# Patient Record
Sex: Female | Born: 1955 | Race: White | Hispanic: No | Marital: Single | Smoking: Never smoker
Health system: Southern US, Community
[De-identification: ages and names within clinical notes are randomized; demographics above are authoritative.]

## PROBLEM LIST (undated history)

## (undated) DIAGNOSIS — IMO0002 Reserved for concepts with insufficient information to code with codable children: Secondary | ICD-10-CM

## (undated) DIAGNOSIS — N841 Polyp of cervix uteri: Secondary | ICD-10-CM

## (undated) DIAGNOSIS — R6882 Decreased libido: Secondary | ICD-10-CM

## (undated) DIAGNOSIS — D219 Benign neoplasm of connective and other soft tissue, unspecified: Secondary | ICD-10-CM

## (undated) DIAGNOSIS — G4700 Insomnia, unspecified: Secondary | ICD-10-CM

## (undated) DIAGNOSIS — A64 Unspecified sexually transmitted disease: Secondary | ICD-10-CM

## (undated) DIAGNOSIS — F419 Anxiety disorder, unspecified: Secondary | ICD-10-CM

## (undated) HISTORY — DX: Reserved for concepts with insufficient information to code with codable children: IMO0002

## (undated) HISTORY — DX: Anxiety disorder, unspecified: F41.9

## (undated) HISTORY — DX: Insomnia, unspecified: G47.00

## (undated) HISTORY — DX: Benign neoplasm of connective and other soft tissue, unspecified: D21.9

## (undated) HISTORY — DX: Polyp of cervix uteri: N84.1

## (undated) HISTORY — DX: Decreased libido: R68.82

## (undated) HISTORY — DX: Unspecified sexually transmitted disease: A64

---

## 1996-11-15 DIAGNOSIS — R6882 Decreased libido: Secondary | ICD-10-CM

## 1996-11-15 HISTORY — DX: Decreased libido: R68.82

## 1998-03-04 ENCOUNTER — Other Ambulatory Visit: Admission: RE | Admit: 1998-03-04 | Discharge: 1998-03-04 | Payer: Self-pay | Admitting: Obstetrics and Gynecology

## 1999-07-28 ENCOUNTER — Other Ambulatory Visit: Admission: RE | Admit: 1999-07-28 | Discharge: 1999-07-28 | Payer: Self-pay | Admitting: Obstetrics and Gynecology

## 2000-07-28 ENCOUNTER — Other Ambulatory Visit: Admission: RE | Admit: 2000-07-28 | Discharge: 2000-07-28 | Payer: Self-pay | Admitting: Obstetrics and Gynecology

## 2001-08-29 ENCOUNTER — Other Ambulatory Visit: Admission: RE | Admit: 2001-08-29 | Discharge: 2001-08-29 | Payer: Self-pay | Admitting: Obstetrics and Gynecology

## 2002-08-29 ENCOUNTER — Other Ambulatory Visit: Admission: RE | Admit: 2002-08-29 | Discharge: 2002-08-29 | Payer: Self-pay | Admitting: Obstetrics and Gynecology

## 2004-01-01 ENCOUNTER — Other Ambulatory Visit: Admission: RE | Admit: 2004-01-01 | Discharge: 2004-01-01 | Payer: Self-pay | Admitting: Obstetrics and Gynecology

## 2005-02-08 ENCOUNTER — Other Ambulatory Visit: Admission: RE | Admit: 2005-02-08 | Discharge: 2005-02-08 | Payer: Self-pay | Admitting: Obstetrics and Gynecology

## 2005-04-30 ENCOUNTER — Ambulatory Visit (HOSPITAL_COMMUNITY): Admission: RE | Admit: 2005-04-30 | Discharge: 2005-04-30 | Payer: Self-pay | Admitting: Gastroenterology

## 2006-04-13 ENCOUNTER — Other Ambulatory Visit: Admission: RE | Admit: 2006-04-13 | Discharge: 2006-04-13 | Payer: Self-pay | Admitting: Obstetrics and Gynecology

## 2008-02-06 ENCOUNTER — Other Ambulatory Visit: Admission: RE | Admit: 2008-02-06 | Discharge: 2008-02-06 | Payer: Self-pay | Admitting: Obstetrics and Gynecology

## 2008-11-15 DIAGNOSIS — D219 Benign neoplasm of connective and other soft tissue, unspecified: Secondary | ICD-10-CM

## 2008-11-15 HISTORY — PX: HYSTEROSCOPY: SHX211

## 2008-11-15 HISTORY — DX: Benign neoplasm of connective and other soft tissue, unspecified: D21.9

## 2009-02-19 ENCOUNTER — Other Ambulatory Visit: Admission: RE | Admit: 2009-02-19 | Discharge: 2009-02-19 | Payer: Self-pay | Admitting: Obstetrics and Gynecology

## 2009-03-31 ENCOUNTER — Ambulatory Visit (HOSPITAL_BASED_OUTPATIENT_CLINIC_OR_DEPARTMENT_OTHER): Admission: RE | Admit: 2009-03-31 | Discharge: 2009-03-31 | Payer: Self-pay | Admitting: Obstetrics and Gynecology

## 2009-03-31 ENCOUNTER — Encounter: Payer: Self-pay | Admitting: Obstetrics and Gynecology

## 2009-03-31 DIAGNOSIS — R87619 Unspecified abnormal cytological findings in specimens from cervix uteri: Secondary | ICD-10-CM

## 2009-03-31 DIAGNOSIS — IMO0002 Reserved for concepts with insufficient information to code with codable children: Secondary | ICD-10-CM

## 2009-03-31 HISTORY — DX: Reserved for concepts with insufficient information to code with codable children: IMO0002

## 2009-03-31 HISTORY — DX: Unspecified abnormal cytological findings in specimens from cervix uteri: R87.619

## 2011-02-23 LAB — CBC
HCT: 42.9 % (ref 36.0–46.0)
Hemoglobin: 14.8 g/dL (ref 12.0–15.0)
MCHC: 34.5 g/dL (ref 30.0–36.0)
MCV: 91.2 fL (ref 78.0–100.0)
Platelets: 241 10*3/uL (ref 150–400)
RDW: 12.1 % (ref 11.5–15.5)

## 2011-03-30 NOTE — Op Note (Signed)
Lori, Hampton               ACCOUNT NO.:  [id number]   MEDICAL RECORD NO.:  [id number]          PATIENT TYPE:  AMB   LOCATION:  [facility]                         FACILITY:  [facility]   PHYSICIAN:  Layken P. Romine, M.D.DATE OF BIRTH:  01/21/1956   DATE OF PROCEDURE:  03/31/2009  DATE OF DISCHARGE:                               OPERATIVE REPORT   PREOPERATIVE DIAGNOSIS:  Atypical endometrial cells on Pap, known  fibroid.   POSTOPERATIVE DIAGNOSIS:  Atypical endometrial cells on Pap, known  fibroid, pathology pending.   PROCEDURE:  Hysteroscopic resection of small polyps and hysteroscopic  resection of multiple submucous fibroids.   SURGEON:  Ciera P. Romine, M.D.   ANESTHESIA:  General by LMA.   ESTIMATED BLOOD LOSS:  Minimal.   SORBITOL DEFICIT:  275 mL.   COMPLICATIONS:  None.   PROCEDURE:  The patient was taken to the operating room and after  induction of adequate general anesthesia by LMA was placed in dorsal  lithotomy position and prepped and draped in the usual fashion.  The  bladder was drained with a rubber catheter.  Posterior weighted and  anterior Sims retractor were placed and a paracervical block was  instituted by injecting 10 mL of 1% Xylocaine at each of 3 and 9  o'clock.  The uterus sounded to 8 cm.  The cervix was dilated to #23  [name].  A diagnostic hysteroscope was inserted.  Sorbitol was used as a  distention medium.  Pressure pump for the sorbitol was set at 70 mmHg.  Hysteroscopy revealed the endometrial cavity to be atrophic.  There was  no noted endometrial pathology except for a very long and thin polyp  tucked way up in the patient's right cornu obstructing the right tubal  os.  The resectoscope was felt to be necessary to remove this polyp.  Photographic documentation was taken.  The scope was withdrawn and the  operative hysteroscope was set up.  The cervix was dilated to #31 [name].  The scope was inserted.  The single loop was used to excise  a small  polyp.  As it did so, a small flap of endometrium was raised and in an  effort to avoid a lifted flap in the endometrium when the surgeon went  to just remove that piece of endometrium a submucous myoma came into  view underneath endometrium and therefore it was resected.  As the  resection took place, more and more fibroids came into view and these  were resected as they were seen.  There was a rather significant  specimen of fibroid sent to pathology at the completion of the case.  It  appeared to be in total about 3 x 3 x 3 cm of fibroids segment.  Photographic documentation was taken of the cavity  the end of the case.  The scope was withdrawn.  A gentle sharp curettage  was done.  Instruments removed from vagina and the procedure was  terminated.  The patient was given 30 mg of IV Toradol and was taken to  recovery room in satisfactory condition.      [name]  [name], M.D.  Electronically Signed     CPR/MEDQ  D:  03/31/2009  T:  03/31/2009  Job:  [id number]

## 2011-04-02 NOTE — Op Note (Signed)
Lori Hampton, Lori Hampton               ACCOUNT NO.:  [id number]   MEDICAL RECORD NO.:  [id number]          PATIENT TYPE:  AMB   LOCATION:  ENDO                         FACILITY:  [facility]   PHYSICIAN:  Anselmo Rod, M.D.  DATE OF BIRTH:  1956/03/29   DATE OF PROCEDURE:  04/30/2005  DATE OF DISCHARGE:                                 OPERATIVE REPORT   PROCEDURE:  Screening colonoscopy.   ENDOSCOPIST:  Charna Elizabeth, M.D.   INSTRUMENT USED:  Olympus video colonoscope.   INDICATIONS FOR PROCEDURE:  55 year old white female undergoing screening  colonoscopy to rule out colonic polyps, masses, etc.   PREPROCEDURE PREPARATION:  Informed consent was obtained from the patient.  The patient was fasted for eight hours prior to the procedure and prepped  with a bottle of magnesium citrate and a gallon of GoLYTELY the night prior  to the procedure.  The risks and benefits were explained including the 10%  missed rate of cancer and polyps discussed with the patient, as well.   PREPROCEDURE PHYSICAL:  Patient with stable vital signs.  Neck supple.  Chest clear to auscultation.  S1 and S2 regular.  Abdomen soft with normal  bowel sounds.   DESCRIPTION OF PROCEDURE:  The patient was placed in the left lateral  decubitus position, sedated with 80 mg of Demerol and 8 mg Versed in slow  incremental doses.  Once the patient was adequately sedated, maintained on  low flow oxygen and continuous cardiac monitoring, the Olympus video  colonoscope was advanced from the rectum to the cecum without difficulty.  The appendiceal orifice and ileocecal valve were clearly visualized and  photographed.  Small internal hemorrhoids were seen on retroflexion.  No  masses, polyps, erosions, ulcerations, or diverticula were seen.   IMPRESSION:  1.  Normal colonoscopy, no masses, polyps, or diverticulosis seen.  2.  Small internal hemorrhoids seen on retroflexion.   RECOMMENDATIONS:  1.  Continue a high fiber diet  with liberal fluid intake.  2.  Repeat colonoscopy in the next ten years unless the patient develops any      abnormal symptoms in the interim.  3.  Outpatient follow up as need arises in the future.       JNM/MEDQ  D:  04/30/2005  T:  04/30/2005  Job:  [id number]   cc:   Edwena Felty. Romine, M.D.  [address], [address]  [place]  [place] [address]  Fax: [phone]

## 2013-07-04 ENCOUNTER — Encounter: Payer: Self-pay | Admitting: Obstetrics and Gynecology

## 2013-07-04 ENCOUNTER — Ambulatory Visit (INDEPENDENT_AMBULATORY_CARE_PROVIDER_SITE_OTHER): Payer: BC Managed Care – PPO | Admitting: Obstetrics and Gynecology

## 2013-07-04 VITALS — BP 100/64 | HR 72 | Resp 12 | Ht 62.5 in | Wt 122.4 lb

## 2013-07-04 DIAGNOSIS — Z01419 Encounter for gynecological examination (general) (routine) without abnormal findings: Secondary | ICD-10-CM

## 2013-07-04 DIAGNOSIS — Z0000 Encounter for general adult medical examination without abnormal findings: Secondary | ICD-10-CM

## 2013-07-04 LAB — POCT URINALYSIS DIPSTICK
Spec Grav, UA: 1.015
Urobilinogen, UA: NEGATIVE

## 2013-07-04 MED ORDER — CLONAZEPAM 0.5 MG PO TABS: 0.5000 mg | ORAL_TABLET | Freq: Two times a day (BID) | ORAL | Status: AC | PRN

## 2013-07-04 NOTE — Progress Notes (Signed)
57 y.o.   Single    Caucasian   female   G3P0   here for annual exam.  No vaginal bleeding.  Uses Klonopin about 4 or 5 times a month.    No LMP recorded. Patient is postmenopausal.          Sexually active: yes  The current method of family planning is post menopausal status.    Exercising: walking and tennis Last mammogram:  04/2013 Last pap smear:  06/26/2012  negative History of abnormal pap:  Smoking:  no Alcohol: yes, x14 weekly  Last colonoscopy:  04/2005 Last Bone Density:  never Last tetanus shot:  06/27/2012 Last cholesterol check: 2013  Hgb: PCP maintains all labs.               Urine:negative    Family History  Problem Relation Age of Onset  . Breast cancer Mother   . Breast cancer Sister     There are no active problems to display for this patient.   Past Medical History  Diagnosis Date  . STD (sexually transmitted disease)     HSV  . Abnormal Pap smear 03/31/09    Atypical endomet cells on pap.  . Fibroid 2010    multiple submucous fibroids  . Polyp of cervix     Endo cx polyp x 2  . Decreased libido 1998  . Anxiety   . Insomnia     Past Surgical History  Procedure Laterality Date  . Hysteroscopy  2010    w/resection of polyp    Allergies: Review of patient's allergies indicates no known allergies.  Current Outpatient Prescriptions  Medication Sig Dispense Refill  . clonazePAM (KLONOPIN) 0.5 MG tablet Take 0.5 mg by mouth 2 (two) times daily as needed for anxiety.      . Multiple Vitamin (MULTI-VITAMIN DAILY PO) Take by mouth daily.       No current facility-administered medications for this visit.    ROS: Pertinent items are noted in HPI.  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  Exam:    BP 100/64  Pulse 72  Resp 12  Ht 5' 2.5" (1.588 m)  Wt 122 lb 6.4 oz (55.52 kg)  BMI 22.02 kg/m2Ht and Wt stable from last year   Wt Readings from Last 3 Encounters:  07/04/13 122 lb 6.4 oz  (55.52 kg)     Ht Readings from Last 3 Encounters:  07/04/13 5' 2.5" (1.588 m)    General appearance: alert, cooperative and appears stated age Head: Normocephalic, without obvious abnormality, atraumatic Neck: no adenopathy, supple, symmetrical, trachea midline and thyroid not enlarged, symmetric, no tenderness/mass/nodules Lungs: clear to auscultation bilaterally Breasts: Inspection negative, No nipple retraction or dimpling, No nipple discharge or bleeding, No axillary or supraclavicular adenopathy, Normal to palpation without dominant masses Heart: regular rate and rhythm Abdomen: soft, non-tender; bowel sounds normal; no masses,  no organomegaly Extremities: extremities normal, atraumatic, no cyanosis or edema Skin: Skin color, texture, turgor normal. No rashes or lesions Lymph nodes: Cervical, supraclavicular, and axillary nodes normal. No abnormal inguinal nodes palpated Neurologic: Grossly normal   Pelvic: External genitalia:  no lesions              Urethra:  normal appearing urethra with no masses, tenderness or lesions              Bartholins and Skenes: normal  Vagina: normal appearing vagina with normal color and discharge, no lesions              Cervix: normal appearance              Pap taken: no        Bimanual Exam:  Uterus:  uterus is normal size, shape, consistency and nontender, can feel one small fibroid about 2 cm anterior fundus                                      Adnexa: normal adnexa in size, nontender and no masses                                      Rectovaginal: Confirms                                      Anus:  normal sphincter tone, no lesions  A: normal menopausal exam, no HRT     Known HSV     Known multiple fibroids, each 1-3 cm in 2010 on PUS     Chronic insomnia     Strong FH breast cancer - mother, two sisters, all in their 17's - pt has declined genetic testing     P:     mammogram counseled on breast self exam,  mammography screening, adequate intake of calcium and vitamin D, diet and exercise return annually or prn   RF Klonopin I brought up the option for genetic testing again today with the patient.  She says she will think about it, but she thinks that if she knew she had one of the genes for breast cancer, it would take away her enjoyment of life "forever" and that if she is to get breast cancer, it is in God's hands.     An After Visit Summary was printed and given to the patient.

## 2013-07-04 NOTE — Patient Instructions (Signed)

## 2013-11-07 ENCOUNTER — Encounter: Payer: Self-pay | Admitting: Nurse Practitioner

## 2014-05-30 ENCOUNTER — Other Ambulatory Visit: Payer: Self-pay | Admitting: Family Medicine

## 2014-05-30 ENCOUNTER — Other Ambulatory Visit (HOSPITAL_COMMUNITY)
Admission: RE | Admit: 2014-05-30 | Discharge: 2014-05-30 | Disposition: A | Discharge status: Home / Self Care | Payer: BC Managed Care – PPO | Source: Ambulatory Visit | Attending: Family Medicine | Admitting: Family Medicine

## 2014-05-30 DIAGNOSIS — Z124 Encounter for screening for malignant neoplasm of cervix: Secondary | ICD-10-CM | POA: Insufficient documentation

## 2014-06-03 LAB — CYTOLOGY - PAP

## 2014-07-05 ENCOUNTER — Ambulatory Visit: Payer: BC Managed Care – PPO | Admitting: Nurse Practitioner

## 2014-07-30 ENCOUNTER — Ambulatory Visit: Payer: BC Managed Care – PPO | Admitting: Nurse Practitioner

## 2014-09-16 ENCOUNTER — Encounter: Payer: Self-pay | Admitting: Obstetrics and Gynecology

## 2014-09-19 ENCOUNTER — Telehealth: Payer: Self-pay | Admitting: *Deleted

## 2014-09-19 NOTE — Telephone Encounter (Signed)
Fax From: [facility] for Clonazepam 0.5 mg  Last Refilled: 07/04/13 #30/1 rfs Aex Scheduled: no current AEX scheduled   S/w patient she's seeing Dr. Darcus Austin now this rx was meant to be sent to her office.   Called pharmacy and notified them that patient gets this done at Dr. Moses Manners office, pharmacist said she will send them a request.  Routed to provider for review, encounter closed.

## 2014-09-26 ENCOUNTER — Other Ambulatory Visit: Payer: Self-pay

## 2014-09-26 NOTE — Telephone Encounter (Signed)
Incoming Refill Request from [facility] VO:[id number] 0.5mg   Last AEX:07/04/13 Last Refill:07/04/13 #30 X 1 Next AEX:NS   Please Advise  LMTCB concerning refill request and scheduling AEX

## 2014-09-26 NOTE — Telephone Encounter (Signed)
Encounter closed

## 2014-09-26 NOTE — Telephone Encounter (Signed)
Patient does not need refill. She has transferred care to another GYN and is getting care there per patient.

## 2017-11-21 ENCOUNTER — Other Ambulatory Visit: Payer: Self-pay | Admitting: Family Medicine

## 2017-11-21 ENCOUNTER — Other Ambulatory Visit (HOSPITAL_COMMUNITY)
Admission: RE | Admit: 2017-11-21 | Discharge: 2017-11-21 | Disposition: A | Discharge status: Home / Self Care | Payer: BLUE CROSS/BLUE SHIELD | Source: Ambulatory Visit | Attending: Family Medicine | Admitting: Family Medicine

## 2017-11-21 DIAGNOSIS — Z01411 Encounter for gynecological examination (general) (routine) with abnormal findings: Secondary | ICD-10-CM | POA: Insufficient documentation

## 2017-11-22 LAB — CYTOLOGY - PAP
Diagnosis: NEGATIVE
HPV (WINDOPATH): NOT DETECTED

## 2019-03-30 ENCOUNTER — Other Ambulatory Visit: Payer: Self-pay | Admitting: Family Medicine

## 2019-03-30 ENCOUNTER — Other Ambulatory Visit: Payer: Self-pay

## 2019-03-30 ENCOUNTER — Ambulatory Visit
Admission: RE | Admit: 2019-03-30 | Discharge: 2019-03-30 | Disposition: A | Discharge status: Home / Self Care | Payer: No Typology Code available for payment source | Source: Ambulatory Visit | Attending: Family Medicine | Admitting: Family Medicine

## 2019-03-30 DIAGNOSIS — R52 Pain, unspecified: Secondary | ICD-10-CM

## 2019-09-20 DIAGNOSIS — Z20828 Contact with and (suspected) exposure to other viral communicable diseases: Secondary | ICD-10-CM | POA: Diagnosis not present

## 2019-10-09 DIAGNOSIS — Z803 Family history of malignant neoplasm of breast: Secondary | ICD-10-CM | POA: Diagnosis not present

## 2019-10-09 DIAGNOSIS — Z1231 Encounter for screening mammogram for malignant neoplasm of breast: Secondary | ICD-10-CM | POA: Diagnosis not present

## 2019-10-25 DIAGNOSIS — E7800 Pure hypercholesterolemia, unspecified: Secondary | ICD-10-CM | POA: Diagnosis not present

## 2019-10-25 DIAGNOSIS — I10 Essential (primary) hypertension: Secondary | ICD-10-CM | POA: Diagnosis not present

## 2019-10-25 DIAGNOSIS — Z0000 Encounter for general adult medical examination without abnormal findings: Secondary | ICD-10-CM | POA: Diagnosis not present

## 2019-10-25 DIAGNOSIS — Z1211 Encounter for screening for malignant neoplasm of colon: Secondary | ICD-10-CM | POA: Diagnosis not present

## 2019-10-25 DIAGNOSIS — N83201 Unspecified ovarian cyst, right side: Secondary | ICD-10-CM | POA: Diagnosis not present

## 2019-10-25 DIAGNOSIS — G2581 Restless legs syndrome: Secondary | ICD-10-CM | POA: Diagnosis not present

## 2019-10-25 DIAGNOSIS — Z23 Encounter for immunization: Secondary | ICD-10-CM | POA: Diagnosis not present

## 2019-10-25 DIAGNOSIS — G4709 Other insomnia: Secondary | ICD-10-CM | POA: Diagnosis not present

## 2019-11-19 DIAGNOSIS — Z23 Encounter for immunization: Secondary | ICD-10-CM | POA: Diagnosis not present

## 2019-11-19 DIAGNOSIS — Z1211 Encounter for screening for malignant neoplasm of colon: Secondary | ICD-10-CM | POA: Diagnosis not present

## 2019-12-24 DIAGNOSIS — R69 Illness, unspecified: Secondary | ICD-10-CM | POA: Diagnosis not present

## 2019-12-26 DIAGNOSIS — N83201 Unspecified ovarian cyst, right side: Secondary | ICD-10-CM | POA: Diagnosis not present

## 2019-12-26 DIAGNOSIS — Z87410 Personal history of cervical dysplasia: Secondary | ICD-10-CM | POA: Diagnosis not present

## 2019-12-26 DIAGNOSIS — Z01411 Encounter for gynecological examination (general) (routine) with abnormal findings: Secondary | ICD-10-CM | POA: Diagnosis not present

## 2020-01-01 DIAGNOSIS — N83201 Unspecified ovarian cyst, right side: Secondary | ICD-10-CM | POA: Diagnosis not present

## 2020-02-06 DIAGNOSIS — H2513 Age-related nuclear cataract, bilateral: Secondary | ICD-10-CM | POA: Diagnosis not present

## 2020-02-06 DIAGNOSIS — H524 Presbyopia: Secondary | ICD-10-CM | POA: Diagnosis not present

## 2020-02-06 DIAGNOSIS — H25013 Cortical age-related cataract, bilateral: Secondary | ICD-10-CM | POA: Diagnosis not present

## 2020-02-06 DIAGNOSIS — H5203 Hypermetropia, bilateral: Secondary | ICD-10-CM | POA: Diagnosis not present

## 2020-03-10 DIAGNOSIS — Z0100 Encounter for examination of eyes and vision without abnormal findings: Secondary | ICD-10-CM | POA: Diagnosis not present

## 2020-04-22 DIAGNOSIS — I10 Essential (primary) hypertension: Secondary | ICD-10-CM | POA: Diagnosis not present

## 2020-04-22 DIAGNOSIS — L409 Psoriasis, unspecified: Secondary | ICD-10-CM | POA: Diagnosis not present

## 2020-04-22 DIAGNOSIS — G4709 Other insomnia: Secondary | ICD-10-CM | POA: Diagnosis not present

## 2020-07-07 DIAGNOSIS — R69 Illness, unspecified: Secondary | ICD-10-CM | POA: Diagnosis not present

## 2020-10-17 DIAGNOSIS — Z1231 Encounter for screening mammogram for malignant neoplasm of breast: Secondary | ICD-10-CM | POA: Diagnosis not present

## 2020-10-28 DIAGNOSIS — M25551 Pain in right hip: Secondary | ICD-10-CM | POA: Diagnosis not present

## 2020-10-28 DIAGNOSIS — I10 Essential (primary) hypertension: Secondary | ICD-10-CM | POA: Diagnosis not present

## 2020-10-28 DIAGNOSIS — E2839 Other primary ovarian failure: Secondary | ICD-10-CM | POA: Diagnosis not present

## 2020-10-28 DIAGNOSIS — G4709 Other insomnia: Secondary | ICD-10-CM | POA: Diagnosis not present

## 2020-10-28 DIAGNOSIS — E7800 Pure hypercholesterolemia, unspecified: Secondary | ICD-10-CM | POA: Diagnosis not present

## 2020-10-28 DIAGNOSIS — G2581 Restless legs syndrome: Secondary | ICD-10-CM | POA: Diagnosis not present

## 2020-10-28 DIAGNOSIS — N83201 Unspecified ovarian cyst, right side: Secondary | ICD-10-CM | POA: Diagnosis not present

## 2020-10-28 DIAGNOSIS — Z1211 Encounter for screening for malignant neoplasm of colon: Secondary | ICD-10-CM | POA: Diagnosis not present

## 2020-10-28 DIAGNOSIS — Z0000 Encounter for general adult medical examination without abnormal findings: Secondary | ICD-10-CM | POA: Diagnosis not present

## 2020-10-29 DIAGNOSIS — R922 Inconclusive mammogram: Secondary | ICD-10-CM | POA: Diagnosis not present

## 2020-10-29 DIAGNOSIS — R928 Other abnormal and inconclusive findings on diagnostic imaging of breast: Secondary | ICD-10-CM | POA: Diagnosis not present

## 2020-11-11 ENCOUNTER — Other Ambulatory Visit: Payer: Self-pay | Admitting: Psychiatric/Mental Health

## 2020-11-11 ENCOUNTER — Other Ambulatory Visit: Payer: Self-pay | Admitting: Family Medicine

## 2020-11-11 DIAGNOSIS — E2839 Other primary ovarian failure: Secondary | ICD-10-CM

## 2020-11-18 DIAGNOSIS — Z1211 Encounter for screening for malignant neoplasm of colon: Secondary | ICD-10-CM | POA: Diagnosis not present

## 2020-11-25 ENCOUNTER — Encounter: Payer: Self-pay | Admitting: Sports Medicine

## 2020-11-25 ENCOUNTER — Other Ambulatory Visit: Payer: Self-pay

## 2020-11-25 ENCOUNTER — Other Ambulatory Visit: Payer: No Typology Code available for payment source | Admitting: Sports Medicine

## 2020-11-25 ENCOUNTER — Ambulatory Visit (INDEPENDENT_AMBULATORY_CARE_PROVIDER_SITE_OTHER): Payer: Medicare HMO | Admitting: Sports Medicine

## 2020-11-25 VITALS — BP 140/80 | Ht 62.0 in | Wt 125.0 lb

## 2020-11-25 DIAGNOSIS — M25552 Pain in left hip: Secondary | ICD-10-CM | POA: Diagnosis not present

## 2020-11-25 DIAGNOSIS — M25551 Pain in right hip: Secondary | ICD-10-CM | POA: Diagnosis not present

## 2020-11-25 NOTE — Patient Instructions (Signed)
Thank you for coming in to see [place] today! Please see below to review our plan for today's visit:   1.   You may continue Tylenol as needed for your pain, and can also try topical capsaicin. 2.   Please go to [facility] imaging to get hip and back x-rays, we will call you with the results of these. 3.  You are also given a home exercise program for hip strengthening, please do these at home as instructed today.  Based on the results of the x-ray, we will make plans regarding further interventions.  Please expect a phone call from our office to discuss the results and further planning.   Please call the clinic at [phone] if your symptoms worsen or you have any concerns. It was our pleasure to serve you.       Dr. Dagoberto Ligas [facility]

## 2020-11-25 NOTE — Progress Notes (Signed)
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  She reports that in May 2020, she was having fairly severe anterior hip pain, obtained an MRI which showed a labral tear.  She manages conservatively and had some improvement over the following months, however about a year ago started having bilateral anterior and lateral hip pain.  No clear inciting factor.  She describes as an aching pain, aggravated by sitting for a long time, standing from a seated position, tennis, walking on hard surfaces.  Relieved somewhat by NSAIDs and relative rest from tennis.  She has not noticed any numbness, tingling or weakness.  Sometimes the pain radiates into her anterior thighs but never down her legs.  She has not noticed that the pain especially worsens with flexion of the lumbar spine, extension of lumbar spine, or prolonged walking.  No numbness, tingling, weakness that she is aware of.   Review of Systems:  Per HPI.   [place], medications and smoking status reviewed.      Objective:  Physical Exam:  [place] Adult Exercise 11/25/2020  Frequency of aerobic exercise (# of days/week) 3  Average time in minutes 45  Frequency of strengthening activities (# of days/week) 0     Gen: awake, alert, NAD, comfortable in exam room Pulm: breathing unlabored  Lumbar spine:  Inspection: No evidence of erythema, ecchymosis, swelling edema.  Palpation: No midline spinal tenderness. Nontender to facets. No paraspinal tenderness of the lumbar spines. Nontender to SI joints.  ROM: Intact to forward flexion, extension, rotation, and bending.  Special tests: Neg straight leg raise bilaterally, negative slump test bilaterally, mildly positive Trendelenburg with hip drop noted most significantly in the left hip.  Bilateral hip:  Inspection: No evidence  of erythema, ecchymosis, swelling, edema  Palpation: No tenderness to palpation to greater trochanteric bursa, ASIS, AIIS. No pain with pelvic compression.  ROM: Limited IR especially of the left hip, some pain with ER.  Normal IR and ER of the right hip without pain. Strength: Intact to resisted hip flexion/extension. Intact to resisted leg flexion/extension.  Special tests: No leg length discrepancy. Neg reverse Thomas test. Neg piriformis testing.  Positive FADIR on the left, negative on right.  Negative FABER bilaterally.     Imaging: Reviewed past x-rays of the right hip which are unremarkable, however there is a partial view of the left hip which shows significant joint space narrowing and sclerosis suggestive of hip OA.  Unfortunately, previous MRI not able to be seen.    Assessment & Plan:  1. bilateral hip pain Patient with bilateral anterior and lateral hip pain.differential includes left hip OA versus lumbar radiculopathy versus lumbar spinal stenosis.  On provocative testing she does appear to have decreased IR and exquisite pain with IR of the left hip, along with x-ray suggestive of left hip OA historically.  Given this, suspect that this is primarily left hip OA with right-sided symptoms due to compensation.  Plan: -X-ray AP pelvis and left hip frog-leg -X-ray lumbar spine -Continue as needed NSAIDs/Tylenol -Home exercise program with focus on hip abduction strengthening given -Discussed trialing topical capsaicin for pain -We will call patient with results of x-rays and discuss further planning, she is interested in hip corticosteroid injection if that seems indicated.    Dagoberto Ligas, MD [facility] Fellow 11/25/2020  12:02 PM   Patient seen and evaluated with the sports medicine fellow.  I agree with the above plan of care.  Left hip pain is most likely secondary to osteoarthritis which is seen on an x-ray from 2020.  We are going to get updated x-rays  including a standing AP of the pelvis.  We will give her some home exercises and she will follow-up in about a month.  We did discuss the possibility of a cortisone injection in the left hip if her symptoms warrant but definitive treatment is total hip arthroplasty.

## 2020-11-26 ENCOUNTER — Ambulatory Visit
Admission: RE | Admit: 2020-11-26 | Discharge: 2020-11-26 | Disposition: A | Discharge status: Home / Self Care | Payer: Medicare HMO | Source: Ambulatory Visit | Attending: Sports Medicine | Admitting: Sports Medicine

## 2020-11-26 DIAGNOSIS — M1612 Unilateral primary osteoarthritis, left hip: Secondary | ICD-10-CM | POA: Diagnosis not present

## 2020-11-26 DIAGNOSIS — M25551 Pain in right hip: Secondary | ICD-10-CM

## 2020-11-26 DIAGNOSIS — M25552 Pain in left hip: Secondary | ICD-10-CM

## 2020-11-26 DIAGNOSIS — M545 Low back pain, unspecified: Secondary | ICD-10-CM | POA: Diagnosis not present

## 2020-11-28 ENCOUNTER — Telehealth: Payer: Self-pay | Admitting: Sports Medicine

## 2020-11-28 NOTE — Telephone Encounter (Signed)
  I spoke with Lori Hampton on the phone today after reviewing x-rays of her lumbar spine and her left hip.  Left hip shows advanced DJD.  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  She will go ahead and start her home exercises.  I have advised her against any sort of repetitive lumbar extension as this may exacerbate her low back pain secondary to her spondylolisthesis.  She will follow-up with [place] again in 4 weeks.  I did discuss the possibility of a left hip intra-articular cortisone injection as well as formal PT for her lumbar spine.  She may elect to return to the office sooner than 4 weeks for the injection.

## 2020-12-25 ENCOUNTER — Ambulatory Visit: Payer: Medicare HMO | Admitting: Sports Medicine

## 2020-12-25 ENCOUNTER — Other Ambulatory Visit: Payer: Self-pay

## 2020-12-25 VITALS — BP 130/82 | Ht 62.0 in | Wt 125.0 lb

## 2020-12-25 DIAGNOSIS — M1612 Unilateral primary osteoarthritis, left hip: Secondary | ICD-10-CM

## 2020-12-25 NOTE — Progress Notes (Addendum)
Patient ID: SELENY ALLBRIGHT, female   DOB: 12/20/1955, 65 y.o.   MRN: [id number]  Jenny Reichmann comes in today for follow-up on left hip pain and low back pain.  She admits that she has not been very diligent with her home exercises but has noticed some improvement in her range of motion.  Despite this, she still has pain that is interfering with her quality of life.  Tylenol is ineffective.  We had previously discussed a diagnostic/therapeutic intra-articular left hip injection.  At this point she is ready to proceed with this.  We will schedule it next week with Dr. Buena Irish and she will report back to me 2 weeks later.  Since she is also experiencing some low back pain, I explained to her that the injection would be diagnostic as well as therapeutic.  Definitive treatment for her left hip pain is a total hip arthroplasty.  Since Tylenol alone is ineffective, I did recommend that she try Tylenol with an over-the-counter NSAID since evidence supports combination of these 2 medicines to be more effective than either one alone.

## 2020-12-30 DIAGNOSIS — N83201 Unspecified ovarian cyst, right side: Secondary | ICD-10-CM | POA: Diagnosis not present

## 2020-12-30 DIAGNOSIS — Z01419 Encounter for gynecological examination (general) (routine) without abnormal findings: Secondary | ICD-10-CM | POA: Diagnosis not present

## 2020-12-31 ENCOUNTER — Ambulatory Visit: Payer: Medicare HMO | Admitting: Family Medicine

## 2021-02-13 DIAGNOSIS — H25013 Cortical age-related cataract, bilateral: Secondary | ICD-10-CM | POA: Diagnosis not present

## 2021-02-13 DIAGNOSIS — H2513 Age-related nuclear cataract, bilateral: Secondary | ICD-10-CM | POA: Diagnosis not present

## 2021-02-13 DIAGNOSIS — H5203 Hypermetropia, bilateral: Secondary | ICD-10-CM | POA: Diagnosis not present

## 2021-02-27 ENCOUNTER — Other Ambulatory Visit: Payer: No Typology Code available for payment source

## 2021-05-04 DIAGNOSIS — E7800 Pure hypercholesterolemia, unspecified: Secondary | ICD-10-CM | POA: Diagnosis not present

## 2021-05-04 DIAGNOSIS — M25559 Pain in unspecified hip: Secondary | ICD-10-CM | POA: Diagnosis not present

## 2021-05-04 DIAGNOSIS — I10 Essential (primary) hypertension: Secondary | ICD-10-CM | POA: Diagnosis not present

## 2021-05-04 DIAGNOSIS — G4709 Other insomnia: Secondary | ICD-10-CM | POA: Diagnosis not present

## 2021-05-05 ENCOUNTER — Other Ambulatory Visit (HOSPITAL_COMMUNITY): Payer: Self-pay | Admitting: Family Medicine

## 2021-05-15 DIAGNOSIS — I10 Essential (primary) hypertension: Secondary | ICD-10-CM | POA: Diagnosis not present

## 2021-05-29 DIAGNOSIS — Z0100 Encounter for examination of eyes and vision without abnormal findings: Secondary | ICD-10-CM | POA: Diagnosis not present

## 2021-07-29 ENCOUNTER — Other Ambulatory Visit (HOSPITAL_BASED_OUTPATIENT_CLINIC_OR_DEPARTMENT_OTHER): Payer: Self-pay | Admitting: Family Medicine

## 2021-07-29 DIAGNOSIS — E7800 Pure hypercholesterolemia, unspecified: Secondary | ICD-10-CM

## 2021-08-04 ENCOUNTER — Ambulatory Visit
Admission: RE | Admit: 2021-08-04 | Discharge: 2021-08-04 | Disposition: A | Discharge status: Home / Self Care | Payer: Medicare HMO | Source: Ambulatory Visit | Attending: Family Medicine | Admitting: Family Medicine

## 2021-08-04 ENCOUNTER — Other Ambulatory Visit: Payer: Self-pay

## 2021-08-04 DIAGNOSIS — E2839 Other primary ovarian failure: Secondary | ICD-10-CM

## 2021-08-04 DIAGNOSIS — Z780 Asymptomatic menopausal state: Secondary | ICD-10-CM | POA: Diagnosis not present

## 2021-08-07 ENCOUNTER — Other Ambulatory Visit: Payer: Medicare HMO

## 2021-08-17 ENCOUNTER — Ambulatory Visit (HOSPITAL_BASED_OUTPATIENT_CLINIC_OR_DEPARTMENT_OTHER)
Admission: RE | Admit: 2021-08-17 | Discharge: 2021-08-17 | Disposition: A | Discharge status: Home / Self Care | Payer: Medicare HMO | Source: Ambulatory Visit | Attending: Family Medicine | Admitting: Family Medicine

## 2021-08-17 ENCOUNTER — Other Ambulatory Visit: Payer: Self-pay

## 2021-08-17 DIAGNOSIS — E7800 Pure hypercholesterolemia, unspecified: Secondary | ICD-10-CM | POA: Insufficient documentation

## 2021-09-28 DIAGNOSIS — Z1283 Encounter for screening for malignant neoplasm of skin: Secondary | ICD-10-CM | POA: Diagnosis not present

## 2021-09-28 DIAGNOSIS — L218 Other seborrheic dermatitis: Secondary | ICD-10-CM | POA: Diagnosis not present

## 2021-09-28 DIAGNOSIS — L258 Unspecified contact dermatitis due to other agents: Secondary | ICD-10-CM | POA: Diagnosis not present

## 2021-09-28 DIAGNOSIS — L821 Other seborrheic keratosis: Secondary | ICD-10-CM | POA: Diagnosis not present

## 2021-10-23 DIAGNOSIS — Z1231 Encounter for screening mammogram for malignant neoplasm of breast: Secondary | ICD-10-CM | POA: Diagnosis not present

## 2021-11-10 DIAGNOSIS — Z0000 Encounter for general adult medical examination without abnormal findings: Secondary | ICD-10-CM | POA: Diagnosis not present

## 2021-11-10 DIAGNOSIS — Z1211 Encounter for screening for malignant neoplasm of colon: Secondary | ICD-10-CM | POA: Diagnosis not present

## 2021-11-10 DIAGNOSIS — G4709 Other insomnia: Secondary | ICD-10-CM | POA: Diagnosis not present

## 2021-11-10 DIAGNOSIS — R7301 Impaired fasting glucose: Secondary | ICD-10-CM | POA: Diagnosis not present

## 2021-11-10 DIAGNOSIS — M21611 Bunion of right foot: Secondary | ICD-10-CM | POA: Diagnosis not present

## 2021-11-10 DIAGNOSIS — I10 Essential (primary) hypertension: Secondary | ICD-10-CM | POA: Diagnosis not present

## 2021-11-10 DIAGNOSIS — E7800 Pure hypercholesterolemia, unspecified: Secondary | ICD-10-CM | POA: Diagnosis not present

## 2021-11-20 DIAGNOSIS — I10 Essential (primary) hypertension: Secondary | ICD-10-CM | POA: Diagnosis not present

## 2021-11-23 ENCOUNTER — Ambulatory Visit (INDEPENDENT_AMBULATORY_CARE_PROVIDER_SITE_OTHER): Payer: Medicare HMO | Admitting: Family Medicine

## 2021-11-23 ENCOUNTER — Encounter: Payer: Self-pay | Admitting: Family Medicine

## 2021-11-23 VITALS — BP 128/82 | Ht 61.0 in | Wt 125.0 lb

## 2021-11-23 DIAGNOSIS — M21619 Bunion of unspecified foot: Secondary | ICD-10-CM

## 2021-11-23 DIAGNOSIS — M79671 Pain in right foot: Secondary | ICD-10-CM

## 2021-11-23 DIAGNOSIS — M79672 Pain in left foot: Secondary | ICD-10-CM | POA: Diagnosis not present

## 2021-11-23 NOTE — Patient Instructions (Signed)
You have bunions, transverse arch collapse of your feet. Most people do not need to undergo surgery for this. Buy shoes that have a wide toebox - consider evaluation at [employer]. Use a 1st toe spacer (especially at bedtime) between big and 2nd toes Toe loop to bring that 2nd toe down also. Sports insoles with metatarsal pads - you can move these into different shoes. Tylenol and/or aleve as needed for pain Icing the area 15 minutes at a time 3-4 times a day only if needed. Follow up in 1 month.

## 2021-11-23 NOTE — Progress Notes (Signed)
PCP: Glenis Smoker, MD  Subjective:   HPI: Patient is a 66 y.o. female here for foot pain.  Patient enjoys playing tennis. She has noticed over time that her right 2nd toe is now starting to cross over her great toe. Some discomfort associated with this but wants to prevent this from getting worse if possible. Has known bunions but no pain currently with these. No injury to her feet.  Past Medical History:  Diagnosis Date   Abnormal Pap smear 03/31/09   Atypical endomet cells on pap.   Anxiety    Decreased libido 1998   Fibroid 2010   multiple submucous fibroids   Insomnia    Polyp of cervix    Endo cx polyp x 2   STD (sexually transmitted disease)    HSV    Current Outpatient Medications on File Prior to Visit  Medication Sig Dispense Refill   clonazePAM (KLONOPIN) 0.5 MG tablet Take 1 tablet (0.5 mg total) by mouth 2 (two) times daily as needed for anxiety. 30 tablet 1   Multiple Vitamin (MULTI-VITAMIN DAILY PO) Take by mouth daily.     No current facility-administered medications on file prior to visit.    Past Surgical History:  Procedure Laterality Date   HYSTEROSCOPY  2010   w/resection of polyp    No Known Allergies  BP 128/82    Ht 5\' 1"  (1.549 m)    Wt 125 lb (56.7 kg)    BMI 23.62 kg/m   [facility] 11/25/2020 11/23/2021  Frequency of aerobic exercise (# of days/week) 3 4  Average time in minutes 45 30  Frequency of strengthening activities (# of days/week) 0 0    No flowsheet data found.      Objective:  Physical Exam:  Gen: NAD, comfortable in exam room  Bilateral feet/ankles: Transverse arch collapse bilaterally. Morton's feet.  Mild hammering 2nd digits.  Right 2nd digit crosses dorsal to great toe, not on left.  Hallux rigidus. FROM ankles without pain. No TTP currently. Negative metatarsal squeeze. NV intact distally.   Assessment & Plan:  1. 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  Bunions as well, hammering of 2nd digit.  Toe loop, sports insoles with metatarsal pads, 1st toe spacer.  Shoes with wide toebox.  Discussed considering 1st ray post if she gets pain 1st MTP. F/u in 1 month.

## 2021-12-02 DIAGNOSIS — Z1211 Encounter for screening for malignant neoplasm of colon: Secondary | ICD-10-CM | POA: Diagnosis not present

## 2022-01-04 ENCOUNTER — Encounter: Payer: Self-pay | Admitting: Family Medicine

## 2022-01-04 ENCOUNTER — Ambulatory Visit: Payer: Medicare HMO | Admitting: Family Medicine

## 2022-01-04 VITALS — BP 118/82 | Ht 62.0 in | Wt 125.0 lb

## 2022-01-04 DIAGNOSIS — M79672 Pain in left foot: Secondary | ICD-10-CM | POA: Diagnosis not present

## 2022-01-04 DIAGNOSIS — M21619 Bunion of unspecified foot: Secondary | ICD-10-CM

## 2022-01-04 DIAGNOSIS — M79671 Pain in right foot: Secondary | ICD-10-CM

## 2022-01-04 NOTE — Progress Notes (Signed)
PCP: Glenis Smoker, MD  Subjective:   HPI: Patient is a 66 y.o. female here for foot pain.  1/9: Patient enjoys playing tennis. She has noticed over time that her right 2nd toe is now starting to cross over her great toe. Some discomfort associated with this but wants to prevent this from getting worse if possible. Has known bunions but no pain currently with these. No injury to her feet.  2/20: Patient reports she feels about the same compared to last visit. Has been using toe spacer between 1st and 2nd digits on right - sometimes feels especially at night like foot cramps up with this and also that spacer causes 2nd to go more on top of 1st. Overall pain is manageable. Wearing sports insoles with small metatarsal pads and tolerating well. Able to play tennis. Has not been able to find a hammer toe loop in her size.  Past Medical History:  Diagnosis Date   Abnormal Pap smear 03/31/09   Atypical endomet cells on pap.   Anxiety    Decreased libido 1998   Fibroid 2010   multiple submucous fibroids   Insomnia    Polyp of cervix    Endo cx polyp x 2   STD (sexually transmitted disease)    HSV    Current Outpatient Medications on File Prior to Visit  Medication Sig Dispense Refill   clonazePAM (KLONOPIN) 0.5 MG tablet Take 1 tablet (0.5 mg total) by mouth 2 (two) times daily as needed for anxiety. 30 tablet 1   Multiple Vitamin (MULTI-VITAMIN DAILY PO) Take by mouth daily.     No current facility-administered medications on file prior to visit.    Past Surgical History:  Procedure Laterality Date   HYSTEROSCOPY  2010   w/resection of polyp    No Known Allergies  BP 118/82    Ht 5\' 2"  (1.575 m)    Wt 125 lb (56.7 kg)    BMI 22.86 kg/m   [place] Adult Exercise 11/25/2020 11/23/2021  Frequency of aerobic exercise (# of days/week) 3 4  Average time in minutes 45 30  Frequency of strengthening activities (# of days/week) 0 0    No flowsheet data  found.      Objective:  Physical Exam:  Gen: NAD, comfortable in exam room  Right foot: Transverse arch collapse.  Morton's foot.  Mild hammering 2nd digit, crosses over top of 1st digit.  Hallux rigidus. FROM ankle. No TTP currently. Thompsons test negative. NV intact distally.   Assessment & Plan:  1. Bilateral foot pain - Mainly on right.  Changed to medium metatarsal pad on right.  Used coban to function as a hammer toe loop given difficulty finding small toe loop - coban provided to her as well and shown how to reproduce this.  Shoes with wide toebox.  Consider 1st ray post if pain at 1st MTP.  F/u in 6 weeks or prn.

## 2022-02-15 DIAGNOSIS — H2513 Age-related nuclear cataract, bilateral: Secondary | ICD-10-CM | POA: Diagnosis not present

## 2022-02-15 DIAGNOSIS — H5203 Hypermetropia, bilateral: Secondary | ICD-10-CM | POA: Diagnosis not present

## 2022-02-15 DIAGNOSIS — H25013 Cortical age-related cataract, bilateral: Secondary | ICD-10-CM | POA: Diagnosis not present

## 2022-02-15 DIAGNOSIS — H25041 Posterior subcapsular polar age-related cataract, right eye: Secondary | ICD-10-CM | POA: Diagnosis not present

## 2022-05-17 DIAGNOSIS — M1990 Unspecified osteoarthritis, unspecified site: Secondary | ICD-10-CM | POA: Diagnosis not present

## 2022-05-17 DIAGNOSIS — Z87410 Personal history of cervical dysplasia: Secondary | ICD-10-CM | POA: Diagnosis not present

## 2022-05-17 DIAGNOSIS — Z9189 Other specified personal risk factors, not elsewhere classified: Secondary | ICD-10-CM | POA: Diagnosis not present

## 2022-05-17 DIAGNOSIS — Z01419 Encounter for gynecological examination (general) (routine) without abnormal findings: Secondary | ICD-10-CM | POA: Diagnosis not present

## 2022-05-26 DIAGNOSIS — X32XXXD Exposure to sunlight, subsequent encounter: Secondary | ICD-10-CM | POA: Diagnosis not present

## 2022-05-26 DIAGNOSIS — L820 Inflamed seborrheic keratosis: Secondary | ICD-10-CM | POA: Diagnosis not present

## 2022-05-26 DIAGNOSIS — L570 Actinic keratosis: Secondary | ICD-10-CM | POA: Diagnosis not present

## 2022-07-13 DIAGNOSIS — H25811 Combined forms of age-related cataract, right eye: Secondary | ICD-10-CM | POA: Diagnosis not present

## 2022-07-13 DIAGNOSIS — H269 Unspecified cataract: Secondary | ICD-10-CM | POA: Diagnosis not present

## 2022-07-13 DIAGNOSIS — H2511 Age-related nuclear cataract, right eye: Secondary | ICD-10-CM | POA: Diagnosis not present

## 2022-07-13 DIAGNOSIS — Z961 Presence of intraocular lens: Secondary | ICD-10-CM | POA: Diagnosis not present

## 2022-07-13 DIAGNOSIS — H25041 Posterior subcapsular polar age-related cataract, right eye: Secondary | ICD-10-CM | POA: Diagnosis not present

## 2022-07-27 DIAGNOSIS — H269 Unspecified cataract: Secondary | ICD-10-CM | POA: Diagnosis not present

## 2022-07-27 DIAGNOSIS — H25812 Combined forms of age-related cataract, left eye: Secondary | ICD-10-CM | POA: Diagnosis not present

## 2022-07-27 DIAGNOSIS — H2512 Age-related nuclear cataract, left eye: Secondary | ICD-10-CM | POA: Diagnosis not present

## 2022-07-27 DIAGNOSIS — Z961 Presence of intraocular lens: Secondary | ICD-10-CM | POA: Diagnosis not present

## 2022-07-27 DIAGNOSIS — H25012 Cortical age-related cataract, left eye: Secondary | ICD-10-CM | POA: Diagnosis not present

## 2022-08-02 DIAGNOSIS — H25811 Combined forms of age-related cataract, right eye: Secondary | ICD-10-CM | POA: Diagnosis not present

## 2022-10-27 DIAGNOSIS — R079 Chest pain, unspecified: Secondary | ICD-10-CM | POA: Diagnosis not present

## 2022-10-29 DIAGNOSIS — Z1231 Encounter for screening mammogram for malignant neoplasm of breast: Secondary | ICD-10-CM | POA: Diagnosis not present

## 2022-10-30 IMAGING — CR DG HIP (WITH OR WITHOUT PELVIS) 2-3V*L*
2 series · 2 of 2 positions shown · non-contrast
Comparison: None.

CLINICAL DATA: Low back and bilateral hip pain, worse on the left.
No recent injury.

EXAM:
DG HIP (WITH OR WITHOUT PELVIS) 2-3V LEFT

[t hip ap left]
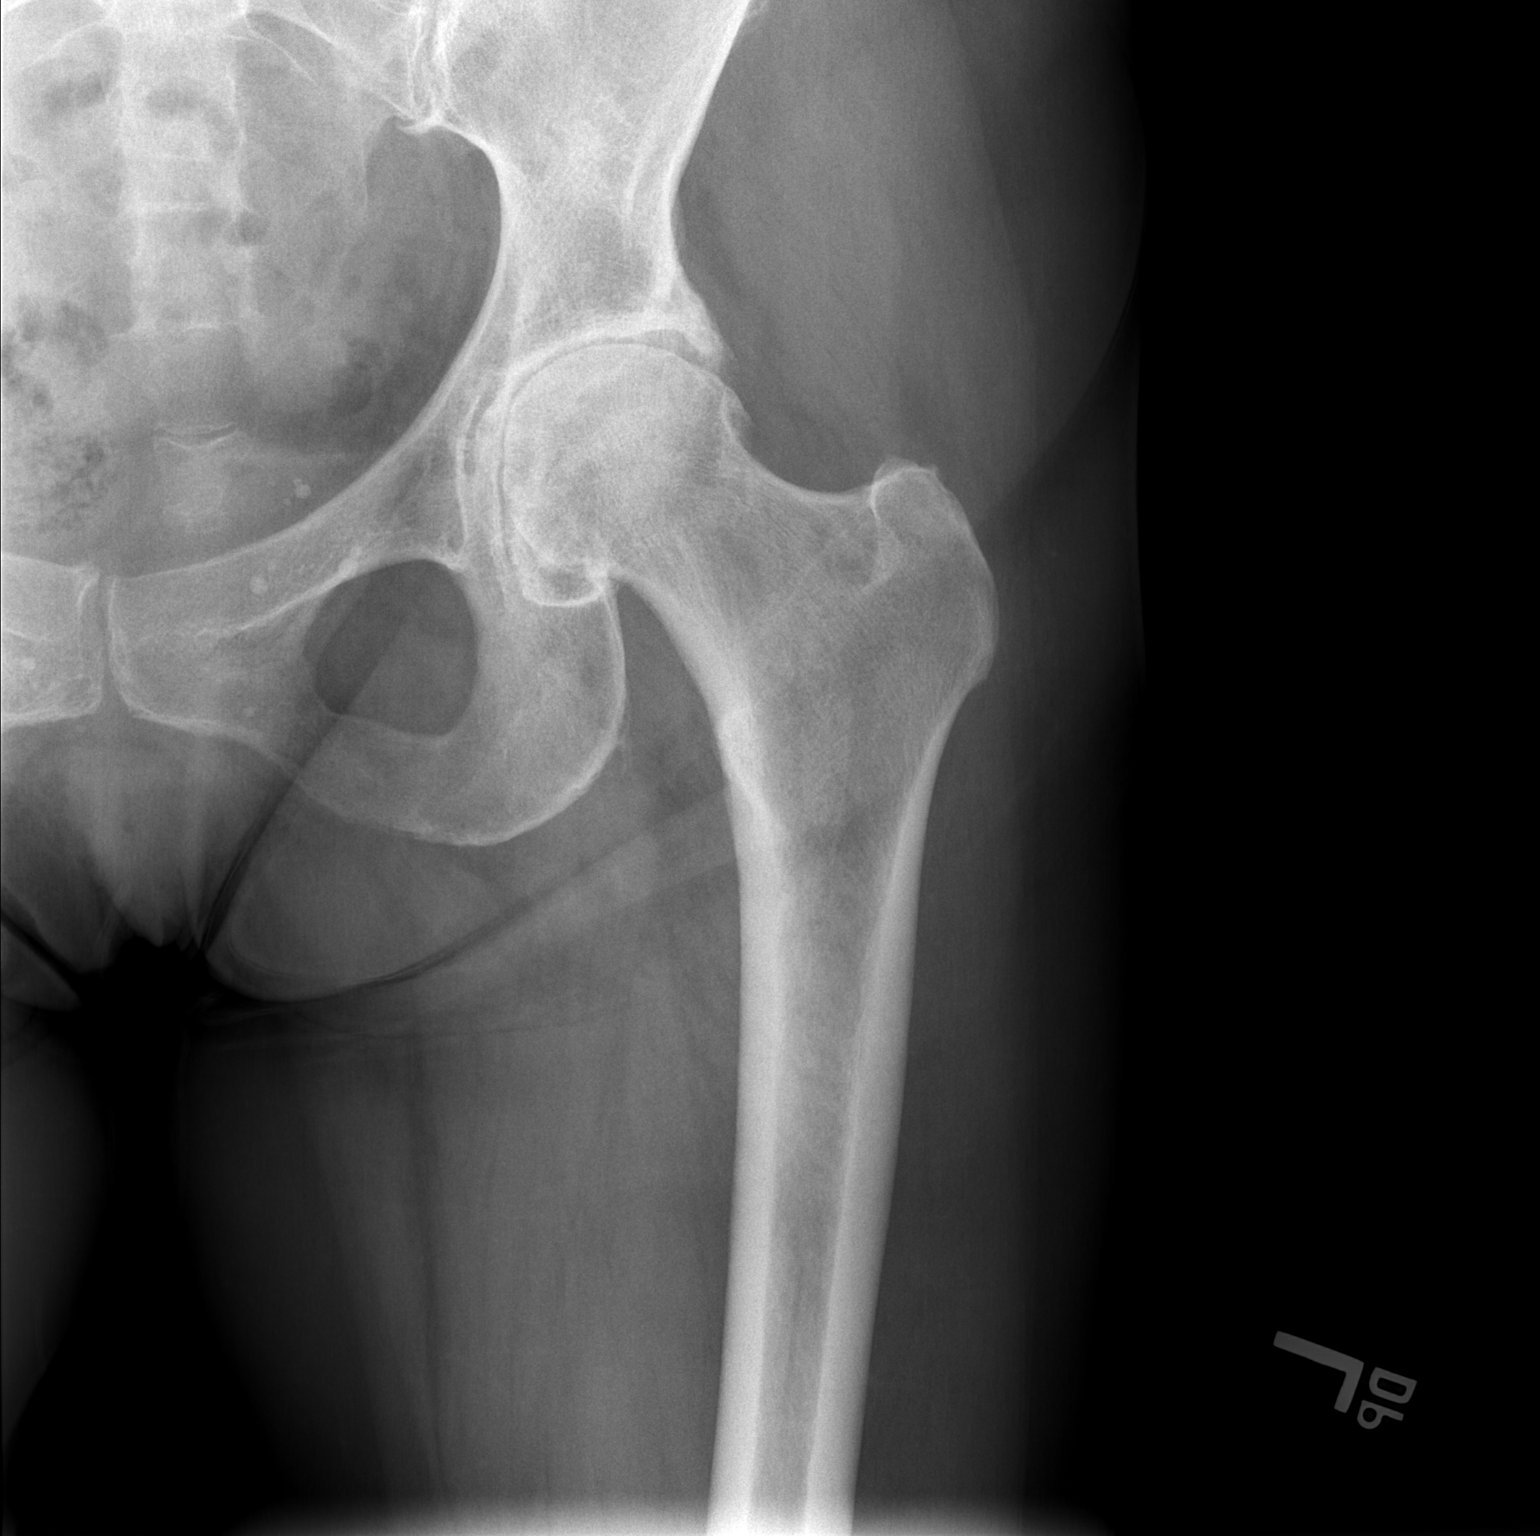

[t hip frog leg left]
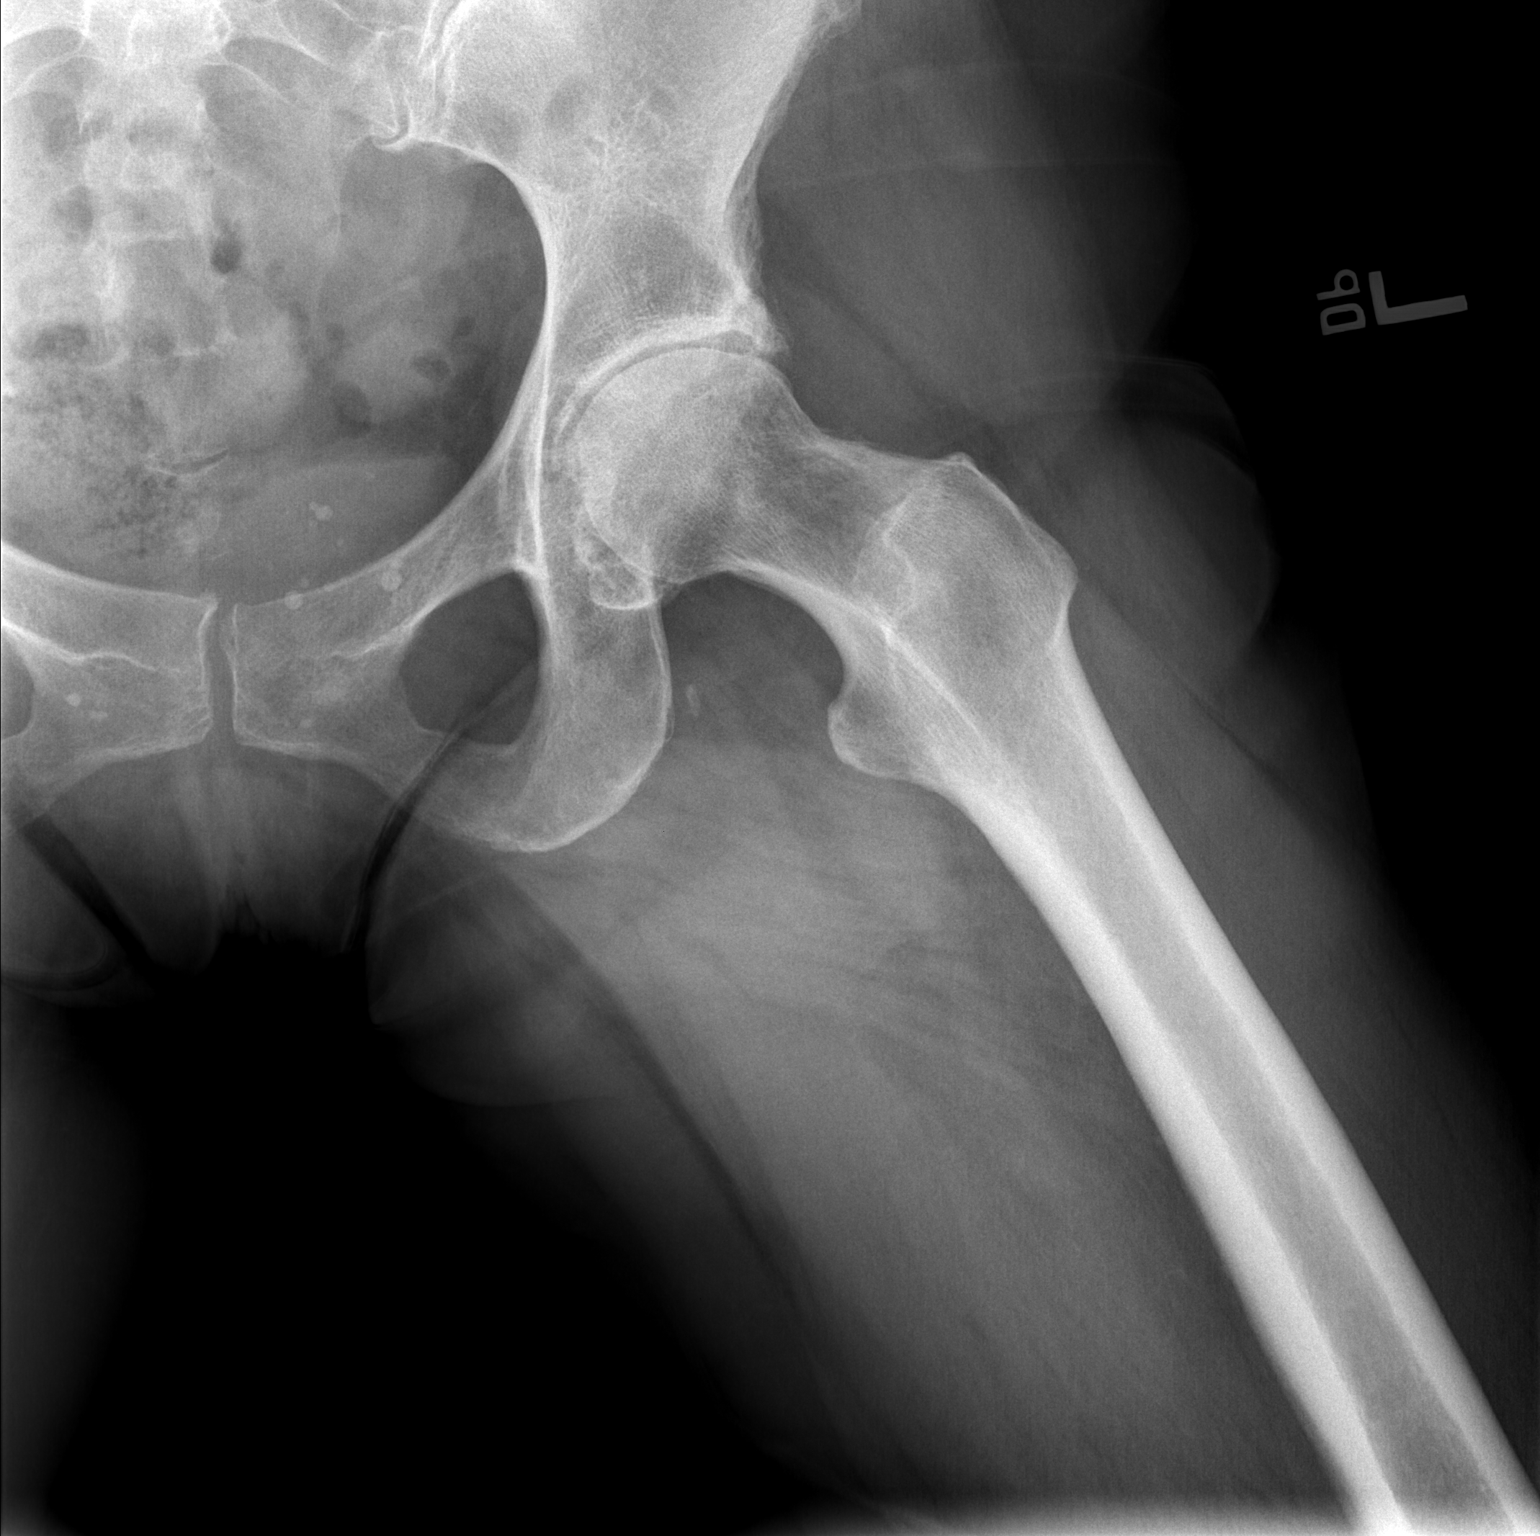

[2 of 2 positions shown; findings below may reference images not displayed]

FINDINGS: There is moderate asymmetric left hip osteoarthritis with joint
space narrowing and osteophytes. No evidence of acute fracture,
dislocation or femoral head avascular necrosis.
IMPRESSION: Moderate asymmetric left hip osteoarthritis.

## 2022-10-30 IMAGING — CR DG PELVIS 1-2V
1 series · 1 of 1 positions shown · non-contrast
Comparison: Right hip radiographs 03/30/2019

CLINICAL DATA: Low back and bilateral hip pain.  No known injury.

EXAM:
PELVIS - 1-2 VIEW

[w pelvis]
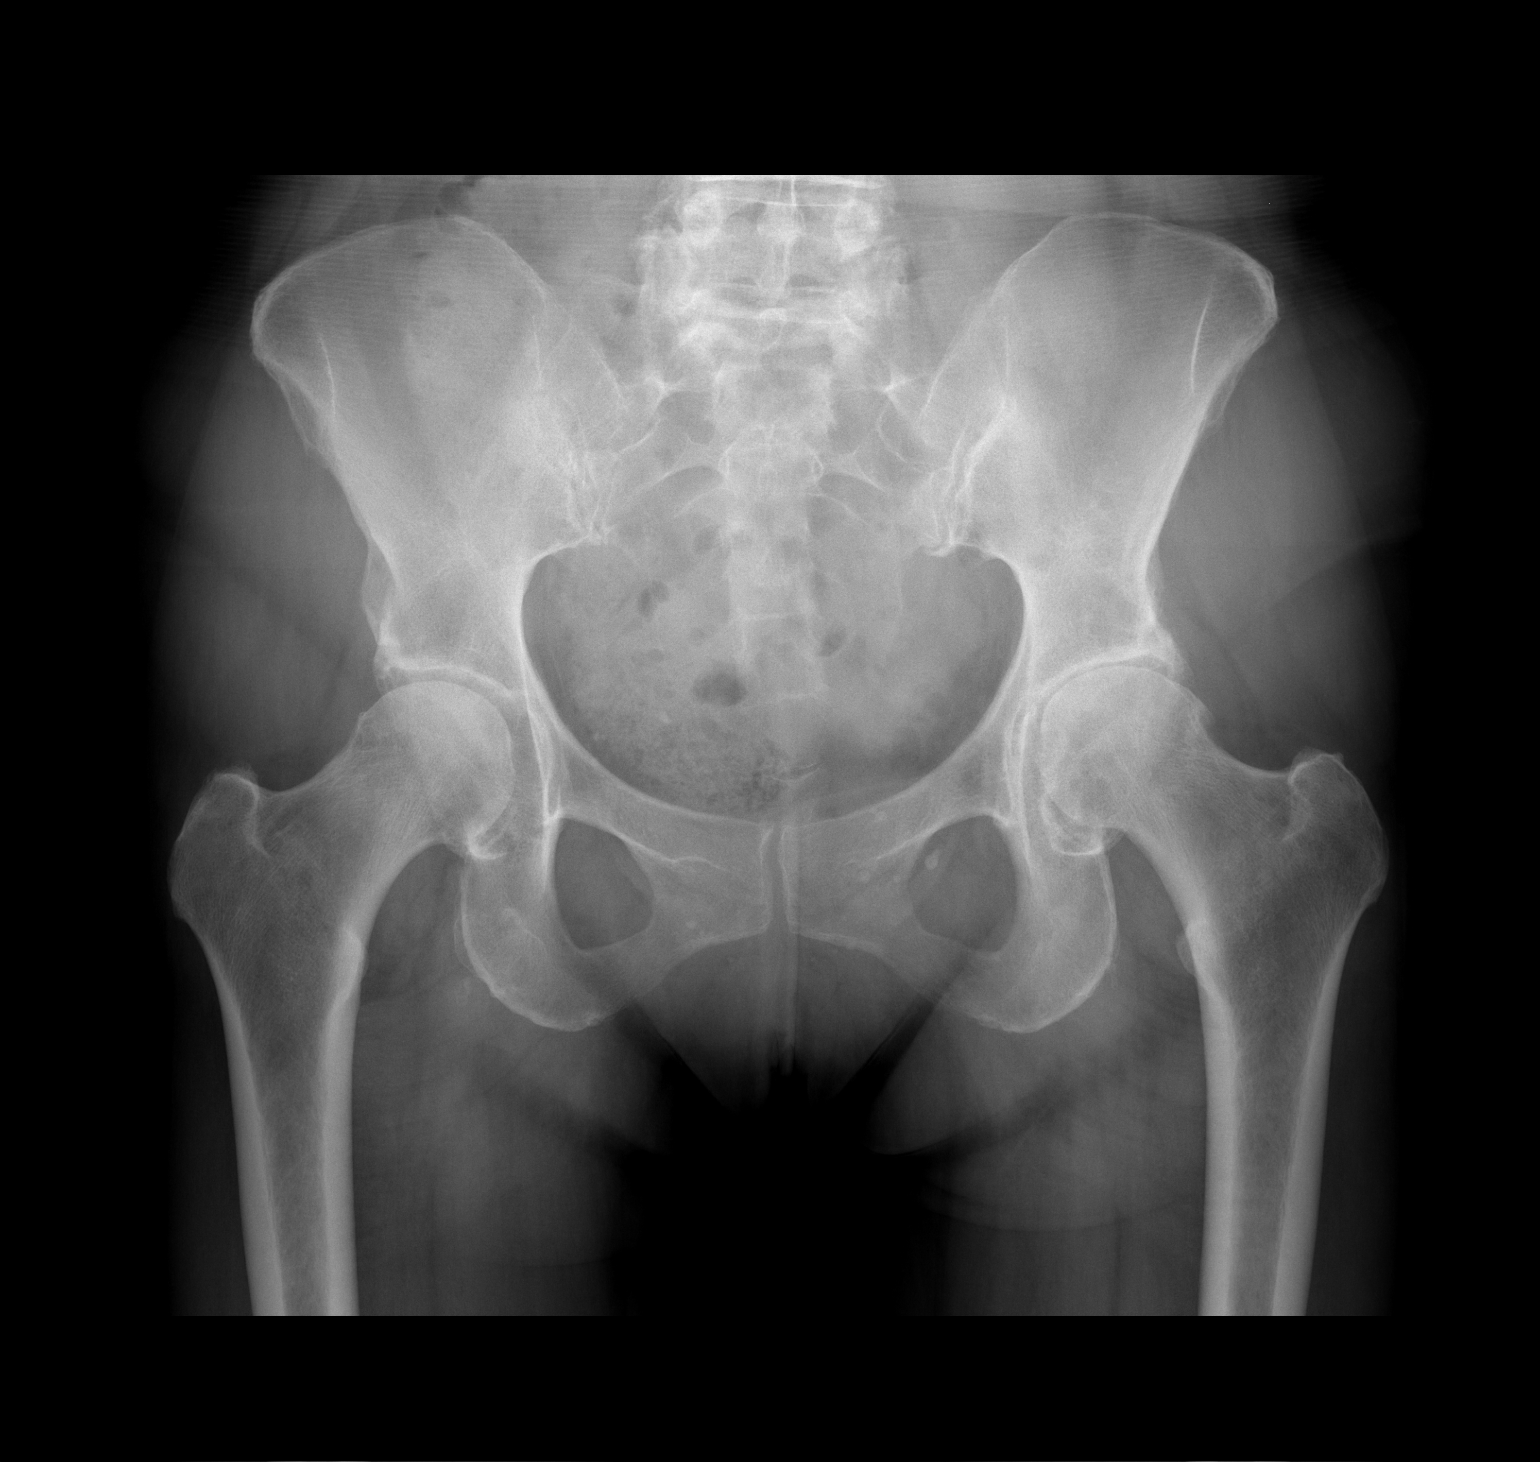

[1 of 1 positions shown; findings below may reference images not displayed]

FINDINGS: The mineralization and alignment are normal. There is no evidence of
acute fracture or dislocation. No evidence of femoral head avascular
necrosis. Moderate asymmetric left hip joint space narrowing.
Minimal joint space narrowing on the right. The sacroiliac joints
are intact.
IMPRESSION: Moderate left hip degenerative changes. No acute osseous findings.

## 2022-11-11 DIAGNOSIS — R922 Inconclusive mammogram: Secondary | ICD-10-CM | POA: Diagnosis not present

## 2022-11-11 DIAGNOSIS — R928 Other abnormal and inconclusive findings on diagnostic imaging of breast: Secondary | ICD-10-CM | POA: Diagnosis not present

## 2022-11-26 DIAGNOSIS — Z1211 Encounter for screening for malignant neoplasm of colon: Secondary | ICD-10-CM | POA: Diagnosis not present

## 2022-11-26 DIAGNOSIS — I10 Essential (primary) hypertension: Secondary | ICD-10-CM | POA: Diagnosis not present

## 2022-11-26 DIAGNOSIS — E7800 Pure hypercholesterolemia, unspecified: Secondary | ICD-10-CM | POA: Diagnosis not present

## 2022-11-26 DIAGNOSIS — L508 Other urticaria: Secondary | ICD-10-CM | POA: Diagnosis not present

## 2022-11-26 DIAGNOSIS — M25551 Pain in right hip: Secondary | ICD-10-CM | POA: Diagnosis not present

## 2022-11-26 DIAGNOSIS — G4709 Other insomnia: Secondary | ICD-10-CM | POA: Diagnosis not present

## 2022-11-26 DIAGNOSIS — Z0000 Encounter for general adult medical examination without abnormal findings: Secondary | ICD-10-CM | POA: Diagnosis not present

## 2022-11-26 DIAGNOSIS — M25552 Pain in left hip: Secondary | ICD-10-CM | POA: Diagnosis not present

## 2022-11-26 DIAGNOSIS — R7309 Other abnormal glucose: Secondary | ICD-10-CM | POA: Diagnosis not present

## 2022-11-26 DIAGNOSIS — R7301 Impaired fasting glucose: Secondary | ICD-10-CM | POA: Diagnosis not present

## 2022-12-01 DIAGNOSIS — Z1211 Encounter for screening for malignant neoplasm of colon: Secondary | ICD-10-CM | POA: Diagnosis not present

## 2022-12-21 DIAGNOSIS — M160 Bilateral primary osteoarthritis of hip: Secondary | ICD-10-CM | POA: Diagnosis not present

## 2022-12-21 DIAGNOSIS — M1612 Unilateral primary osteoarthritis, left hip: Secondary | ICD-10-CM | POA: Diagnosis not present

## 2022-12-21 DIAGNOSIS — M2041 Other hammer toe(s) (acquired), right foot: Secondary | ICD-10-CM | POA: Diagnosis not present

## 2023-01-03 ENCOUNTER — Other Ambulatory Visit: Payer: Self-pay | Admitting: Sports Medicine

## 2023-01-03 ENCOUNTER — Ambulatory Visit
Admission: RE | Admit: 2023-01-03 | Discharge: 2023-01-03 | Disposition: A | Discharge status: Home / Self Care | Payer: Medicare HMO | Source: Ambulatory Visit | Attending: Sports Medicine | Admitting: Sports Medicine

## 2023-01-03 DIAGNOSIS — M25551 Pain in right hip: Secondary | ICD-10-CM | POA: Diagnosis not present

## 2023-03-09 DIAGNOSIS — M160 Bilateral primary osteoarthritis of hip: Secondary | ICD-10-CM | POA: Diagnosis not present

## 2023-05-23 DIAGNOSIS — Z8742 Personal history of other diseases of the female genital tract: Secondary | ICD-10-CM | POA: Diagnosis not present

## 2023-05-23 DIAGNOSIS — Z9189 Other specified personal risk factors, not elsewhere classified: Secondary | ICD-10-CM | POA: Diagnosis not present

## 2023-06-30 DIAGNOSIS — M1612 Unilateral primary osteoarthritis, left hip: Secondary | ICD-10-CM | POA: Diagnosis not present

## 2023-06-30 DIAGNOSIS — M25552 Pain in left hip: Secondary | ICD-10-CM | POA: Diagnosis not present

## 2023-07-19 DIAGNOSIS — Z79899 Other long term (current) drug therapy: Secondary | ICD-10-CM | POA: Diagnosis not present

## 2023-08-23 DIAGNOSIS — M25752 Osteophyte, left hip: Secondary | ICD-10-CM | POA: Diagnosis not present

## 2023-08-23 DIAGNOSIS — M1612 Unilateral primary osteoarthritis, left hip: Secondary | ICD-10-CM | POA: Diagnosis not present

## 2023-10-04 DIAGNOSIS — Z5189 Encounter for other specified aftercare: Secondary | ICD-10-CM | POA: Diagnosis not present

## 2023-10-07 DIAGNOSIS — R69 Illness, unspecified: Secondary | ICD-10-CM | POA: Diagnosis not present

## 2023-10-10 DIAGNOSIS — R69 Illness, unspecified: Secondary | ICD-10-CM | POA: Diagnosis not present

## 2023-11-11 DIAGNOSIS — Z1231 Encounter for screening mammogram for malignant neoplasm of breast: Secondary | ICD-10-CM | POA: Diagnosis not present

## 2023-12-02 DIAGNOSIS — G2581 Restless legs syndrome: Secondary | ICD-10-CM | POA: Diagnosis not present

## 2023-12-02 DIAGNOSIS — B009 Herpesviral infection, unspecified: Secondary | ICD-10-CM | POA: Diagnosis not present

## 2023-12-02 DIAGNOSIS — R7301 Impaired fasting glucose: Secondary | ICD-10-CM | POA: Diagnosis not present

## 2023-12-02 DIAGNOSIS — Z0000 Encounter for general adult medical examination without abnormal findings: Secondary | ICD-10-CM | POA: Diagnosis not present

## 2023-12-02 DIAGNOSIS — L508 Other urticaria: Secondary | ICD-10-CM | POA: Diagnosis not present

## 2023-12-02 DIAGNOSIS — G4709 Other insomnia: Secondary | ICD-10-CM | POA: Diagnosis not present

## 2023-12-02 DIAGNOSIS — E7800 Pure hypercholesterolemia, unspecified: Secondary | ICD-10-CM | POA: Diagnosis not present

## 2023-12-02 DIAGNOSIS — Z1211 Encounter for screening for malignant neoplasm of colon: Secondary | ICD-10-CM | POA: Diagnosis not present

## 2023-12-02 DIAGNOSIS — I10 Essential (primary) hypertension: Secondary | ICD-10-CM | POA: Diagnosis not present

## 2023-12-02 DIAGNOSIS — Z79899 Other long term (current) drug therapy: Secondary | ICD-10-CM | POA: Diagnosis not present

## 2023-12-02 DIAGNOSIS — R5383 Other fatigue: Secondary | ICD-10-CM | POA: Diagnosis not present

## 2023-12-07 DIAGNOSIS — L308 Other specified dermatitis: Secondary | ICD-10-CM | POA: Diagnosis not present

## 2023-12-08 DIAGNOSIS — Z1211 Encounter for screening for malignant neoplasm of colon: Secondary | ICD-10-CM | POA: Diagnosis not present

## 2023-12-20 DIAGNOSIS — I10 Essential (primary) hypertension: Secondary | ICD-10-CM | POA: Diagnosis not present

## 2024-01-09 DIAGNOSIS — H52203 Unspecified astigmatism, bilateral: Secondary | ICD-10-CM | POA: Diagnosis not present

## 2024-01-09 DIAGNOSIS — Z961 Presence of intraocular lens: Secondary | ICD-10-CM | POA: Diagnosis not present

## 2024-01-09 DIAGNOSIS — H26491 Other secondary cataract, right eye: Secondary | ICD-10-CM | POA: Diagnosis not present

## 2024-04-27 DIAGNOSIS — S76012A Strain of muscle, fascia and tendon of left hip, initial encounter: Secondary | ICD-10-CM | POA: Diagnosis not present

## 2024-06-07 DIAGNOSIS — M25552 Pain in left hip: Secondary | ICD-10-CM | POA: Diagnosis not present

## 2024-09-17 DIAGNOSIS — L820 Inflamed seborrheic keratosis: Secondary | ICD-10-CM | POA: Diagnosis not present
